# Patient Record
Sex: Male | Born: 1990 | Race: Black or African American | Hispanic: No | Marital: Single | Smoking: Current every day smoker
Health system: Southern US, Community
[De-identification: ages and names within clinical notes are randomized; demographics above are authoritative.]

---
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
## 2011-02-02 ENCOUNTER — Emergency Department (HOSPITAL_COMMUNITY)
Admission: EM | Admit: 2011-02-02 | Discharge: 2011-02-02 | Disposition: A | Discharge status: Home / Self Care | Payer: PRIVATE HEALTH INSURANCE | Attending: Emergency Medicine | Admitting: Emergency Medicine

## 2011-02-02 ENCOUNTER — Encounter: Payer: Self-pay | Admitting: *Deleted

## 2011-02-02 DIAGNOSIS — N342 Other urethritis: Secondary | ICD-10-CM | POA: Insufficient documentation

## 2011-02-02 DIAGNOSIS — F17200 Nicotine dependence, unspecified, uncomplicated: Secondary | ICD-10-CM | POA: Insufficient documentation

## 2011-02-02 MED ORDER — LIDOCAINE HCL (PF) 1 % IJ SOLN
2.0000 mL | Freq: Once | INTRAMUSCULAR | Status: AC
  Administered 2011-02-02: 2 mL
  Filled 2011-02-02: qty 5

## 2011-02-02 MED ORDER — CEFTRIAXONE SODIUM 250 MG IJ SOLR
250.0000 mg | Freq: Once | INTRAMUSCULAR | Status: AC
  Administered 2011-02-02: 250 mg via INTRAMUSCULAR
  Filled 2011-02-02: qty 250

## 2011-02-02 MED ORDER — DOXYCYCLINE HYCLATE 100 MG PO CAPS: 100.0000 mg | ORAL_CAPSULE | Freq: Two times a day (BID) | ORAL | Status: AC

## 2011-02-02 NOTE — Discharge Instructions (Signed)
Antibiotic for one week. We are treating you for both gonorrhea and Chlamydia. No sex during treatment

## 2011-02-02 NOTE — ED Notes (Signed)
Pt states he would like to get checked for STD, states partner has chlamydia. Denies any symptoms.

## 2011-02-02 NOTE — ED Notes (Signed)
Received pt. From triage, pt. Alert and oriented, denies discharge or S/S of STD, pt. States his girlfriend told him she has a STD.

## 2011-02-02 NOTE — ED Notes (Signed)
Pt. Discharged to home, pt. Ambulatory, gait steady, NAD noted,  

## 2011-02-10 NOTE — ED Provider Notes (Signed)
History     CSN: [id number]  Arrival date & time 02/02/11  1815   First MD Initiated Contact with Patient 02/02/11 1934      Chief Complaint  Patient presents with  . Exposure to STD    (Consider location/radiation/quality/duration/timing/severity/associated sxs/prior treatment) HPI... partner has Chlamydia. He is sexually active with her. No specific discharge or inguinal adenopathy.  No fever or chills. Nothing makes it better or worse  History reviewed. No pertinent past medical history.  History reviewed. No pertinent past surgical history.  History reviewed. No pertinent family history.  History  Substance Use Topics  . Smoking status: Current Everyday Smoker  . Smokeless tobacco: Not on file  . Alcohol Use: Yes      Review of Systems  All other systems reviewed and are negative.    Allergies  Review of patient's allergies indicates no known allergies.  Home Medications   Current Outpatient Rx  Name Route Sig Dispense Refill  . DOXYCYCLINE HYCLATE 100 MG PO CAPS Oral Take 1 capsule (100 mg total) by mouth 2 (two) times daily. 14 capsule 0    BP 116/70  Pulse 73  Temp(Src) 97.8 F (36.6 C) (Oral)  Resp 19  SpO2 100%  Physical Exam  Constitutional: He is oriented to person, place, and time. He appears well-developed and well-nourished.  HENT:  Head: Normocephalic.  Genitourinary:       Normal genitourinary exam  Neurological: He is alert and oriented to person, place, and time.  Skin: Skin is warm and dry.  Psychiatric: He has a normal mood and affect.    ED Course  Procedures (including critical care time)  Labs Reviewed - No data to display No results found.   1. Urethritis       MDM  Will treat patient without cultures.        Donnetta Hutching, MD 02/10/11 [phone]

## 2015-02-26 ENCOUNTER — Emergency Department (HOSPITAL_COMMUNITY): Payer: No Typology Code available for payment source

## 2015-02-26 ENCOUNTER — Encounter (HOSPITAL_COMMUNITY): Payer: Self-pay | Admitting: Emergency Medicine

## 2015-02-26 ENCOUNTER — Emergency Department (HOSPITAL_COMMUNITY)
Admission: EM | Admit: 2015-02-26 | Discharge: 2015-02-26 | Disposition: A | Discharge status: Home / Self Care | Payer: No Typology Code available for payment source | Attending: Emergency Medicine | Admitting: Emergency Medicine

## 2015-02-26 DIAGNOSIS — Y92410 Unspecified street and highway as the place of occurrence of the external cause: Secondary | ICD-10-CM | POA: Diagnosis not present

## 2015-02-26 DIAGNOSIS — S199XXA Unspecified injury of neck, initial encounter: Secondary | ICD-10-CM | POA: Diagnosis present

## 2015-02-26 DIAGNOSIS — F17200 Nicotine dependence, unspecified, uncomplicated: Secondary | ICD-10-CM | POA: Diagnosis not present

## 2015-02-26 DIAGNOSIS — Y9389 Activity, other specified: Secondary | ICD-10-CM | POA: Diagnosis not present

## 2015-02-26 DIAGNOSIS — Y998 Other external cause status: Secondary | ICD-10-CM | POA: Diagnosis not present

## 2015-02-26 DIAGNOSIS — S134XXA Sprain of ligaments of cervical spine, initial encounter: Secondary | ICD-10-CM | POA: Diagnosis not present

## 2015-02-26 MED ORDER — IBUPROFEN 800 MG PO TABS: 800.0000 mg | ORAL_TABLET | Freq: Three times a day (TID) | ORAL | Status: DC

## 2015-02-26 MED ORDER — METHOCARBAMOL 500 MG PO TABS: 500.0000 mg | ORAL_TABLET | Freq: Two times a day (BID) | ORAL | Status: AC

## 2015-02-26 MED ORDER — IBUPROFEN 800 MG PO TABS
800.0000 mg | ORAL_TABLET | Freq: Once | ORAL | Status: AC
  Administered 2015-02-26: 800 mg via ORAL
  Filled 2015-02-26: qty 1
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
## 2021-06-27 ENCOUNTER — Encounter (HOSPITAL_COMMUNITY): Payer: Self-pay | Admitting: Emergency Medicine

## 2021-06-27 ENCOUNTER — Other Ambulatory Visit: Payer: Self-pay

## 2021-06-27 ENCOUNTER — Emergency Department (HOSPITAL_COMMUNITY)
Admission: EM | Admit: 2021-06-27 | Discharge: 2021-06-27 | Disposition: A | Discharge status: Home / Self Care | Payer: PRIVATE HEALTH INSURANCE | Attending: Emergency Medicine | Admitting: Emergency Medicine

## 2021-06-27 DIAGNOSIS — Z23 Encounter for immunization: Secondary | ICD-10-CM | POA: Insufficient documentation

## 2021-06-27 DIAGNOSIS — W260XXA Contact with knife, initial encounter: Secondary | ICD-10-CM | POA: Insufficient documentation

## 2021-06-27 DIAGNOSIS — S61217A Laceration without foreign body of left little finger without damage to nail, initial encounter: Secondary | ICD-10-CM | POA: Insufficient documentation

## 2021-06-27 MED ORDER — TETANUS-DIPHTH-ACELL PERTUSSIS 5-2.5-18.5 LF-MCG/0.5 IM SUSY
0.5000 mL | PREFILLED_SYRINGE | Freq: Once | INTRAMUSCULAR | Status: AC
  Administered 2021-06-27: 0.5 mL via INTRAMUSCULAR
  Filled 2021-06-27: qty 0.5

## 2021-06-27 MED ORDER — IBUPROFEN 800 MG PO TABS: 800.0000 mg | ORAL_TABLET | Freq: Three times a day (TID) | ORAL | 0 refills | Status: AC

## 2021-06-27 MED ORDER — OXYCODONE HCL 5 MG PO TABS
5.0000 mg | ORAL_TABLET | Freq: Once | ORAL | Status: AC
  Administered 2021-06-27: 5 mg via ORAL
  Filled 2021-06-27: qty 1

## 2021-06-27 MED ORDER — BUPIVACAINE HCL (PF) 0.5 % IJ SOLN
20.0000 mL | Freq: Once | INTRAMUSCULAR | Status: AC
  Administered 2021-06-27: 20 mL
  Filled 2021-06-27: qty 20

## 2021-06-27 NOTE — Discharge Instructions (Addendum)
Please return in 7 to 10 days to have your stitches removed.  Read the information about laceration repair attached to these discharge papers, return with any concerns  I have also attached a hand surgery office to these discharge papers for you to follow-up with in 2 weeks if you are still having concerns.

## 2021-06-27 NOTE — ED Triage Notes (Signed)
Pt c/o left pinky laceration from knife while washing dishes; non-stick dressing applied with gauze

## 2021-06-27 NOTE — ED Provider Notes (Signed)
[facility] EMERGENCY DEPARTMENT Provider Note   CSN: [id number] Arrival date & time: 06/27/21  9242     History  Chief Complaint  Patient presents with   Laceration    Allen Herrera is a 31 y.o. male presenting with a left palmar pinky laceration.  Says that just prior to arrival he was watching dishes and cut this finger.  Unknown last tetanus.  Says the knife was clean.  Denies numbness or tingling, currently pain 10 out of 10   Laceration     Home Medications Prior to Admission medications   Medication Sig Start Date End Date Taking? Authorizing Provider  ibuprofen (ADVIL,MOTRIN) 800 MG tablet Take 1 tablet (800 mg total) by mouth 3 (three) times daily. 02/26/15   Fayrene Helper, PA-C  methocarbamol (ROBAXIN) 500 MG tablet Take 1 tablet (500 mg total) by mouth 2 (two) times daily. 02/26/15   Fayrene Helper, PA-C      Allergies    Patient has no known allergies.    Review of Systems   Review of Systems  Physical Exam Updated Vital Signs BP (!) 134/93 (BP Location: Right Arm)   Pulse 88   Temp 98.2 F (36.8 C) (Oral)   Resp 17   Ht 5\' 11"  (1.803 m)   Wt 86.2 kg   SpO2 98%   BMI 26.50 kg/m  Physical Exam Vitals and nursing note reviewed.  Constitutional:      Appearance: Normal appearance.  HENT:     Head: Normocephalic and atraumatic.  Eyes:     General: No scleral icterus.    Conjunctiva/sclera: Conjunctivae normal.  Pulmonary:     Effort: Pulmonary effort is normal. No respiratory distress.  Musculoskeletal:     Comments: 1.5 cm laceration to the finger pad of the left pinky.  Full range of motion at the MCP, PIP and DIP.  No noted foreign bodies.  No exposed vasculature, nerves or tendons.  Strong radial pulse.  Bleeding controlled with pressure.  Patient does have a ring on this finger.  Skin:    Findings: No rash.  Neurological:     Mental Status: He is alert.  Psychiatric:        Mood and Affect: Mood normal.    ED Results /  Procedures / Treatments   Labs (all labs ordered are listed, but only abnormal results are displayed) Labs Reviewed - No data to display  EKG None  Radiology No results found.  Procedures .Marland KitchenLaceration Repair  Date/Time: 06/27/2021 4:34 AM Performed by: Saddie Benders, PA-C Authorized by: Saddie Benders, PA-C   Consent:    Consent obtained:  Verbal   Consent given by:  Patient   Risks discussed:  Pain, infection, need for additional repair, nerve damage, poor cosmetic result, poor wound healing and vascular damage Universal protocol:    Patient identity confirmed:  Verbally with patient Anesthesia:    Anesthesia method:  Nerve block   Block location:  Traditional   Block needle gauge:  25 G   Block anesthetic:  Bupivacaine 0.5% w/o epi   Block injection procedure:  Introduced needle, incremental injection, anatomic landmarks palpated and negative aspiration for blood   Block outcome:  Anesthesia achieved Laceration details:    Location:  Finger   Finger location:  L small finger   Length (cm):  2 Pre-procedure details:    Preparation:  Imaging obtained to evaluate for foreign bodies Exploration:    Limited defect created (wound extended): yes  Hemostasis achieved with:  Direct pressure   Imaging outcome: foreign body not noted     Wound exploration: wound explored through full range of motion and entire depth of wound visualized     Contaminated: no   Treatment:    Amount of cleaning:  Extensive   Irrigation solution:  Sterile saline   Irrigation volume:  300cc   Irrigation method:  Syringe Skin repair:    Repair method:  Sutures   Suture size:  5-0   Suture material:  Nylon   Suture technique:  Simple interrupted   Number of sutures:  7 Approximation:    Approximation:  Close Repair type:    Repair type:  Intermediate Post-procedure details:    Procedure completion:  Tolerated well, no immediate complications Comments:     Digital block  successful. 0 pain throughout procedure. Remained with full ROM during and after    Medications Ordered in ED Medications  bupivacaine(PF) (MARCAINE) 0.5 % injection 20 mL (has no administration in time range)  Tdap (BOOSTRIX) injection 0.5 mL (has no administration in time range)  oxyCODONE (Oxy IR/ROXICODONE) immediate release tablet 5 mg (has no administration in time range)    ED Course/ Medical Decision Making/ A&P                           Medical Decision Making Risk Prescription drug management.   31 year old male presenting today with a finger injury.  Cut his finger on a clean knife.  Unknown last tetanus.  Treatment: Oxycodone given, tetanus updated and 7 nylon sutures placed in patient's pinky laceration after traditional digital block.  Will return in 7 to 10 days for suture removal.  Signs of infection were discussed and he will return with any concerns.  Patient discharged with full range of motion at MCP/PIP and DIP.  Bleeding controlled, neurovascularly intact.   Final Clinical Impression(s) / ED Diagnoses Final diagnoses:  Laceration of left little finger without foreign body without damage to nail, initial encounter    Rx / DC Orders ED Discharge Orders          Ordered    ibuprofen (ADVIL) 800 MG tablet  3 times daily        06/27/21 0437           Results and diagnoses were explained to the patient. Return precautions discussed in full. Patient had no additional questions and expressed complete understanding.   This chart was dictated using voice recognition software.  Despite best efforts to proofread,  errors can occur which can change the documentation meaning.    Darliss Ridgel 06/27/21 [place], MD 06/27/21 2024

## 2021-07-08 ENCOUNTER — Ambulatory Visit (HOSPITAL_COMMUNITY)
Admission: EM | Admit: 2021-07-08 | Discharge: 2021-07-08 | Disposition: A | Discharge status: Home / Self Care | Payer: Self-pay | Attending: Internal Medicine | Admitting: Internal Medicine

## 2021-07-08 NOTE — ED Triage Notes (Signed)
Removed 7 sutures from lt pointer finger. No redness or drainage noted.
